# Patient Record
Sex: Female | Born: 1975
Health system: Southern US, Community
[De-identification: ages and names within clinical notes are randomized; demographics above are authoritative.]

---

## 1998-08-12 ENCOUNTER — Ambulatory Visit (HOSPITAL_COMMUNITY): Admission: RE | Admit: 1998-08-12 | Discharge: 1998-08-12 | Payer: Self-pay | Admitting: *Deleted

## 1999-01-13 ENCOUNTER — Inpatient Hospital Stay (HOSPITAL_COMMUNITY): Admission: AD | Admit: 1999-01-13 | Discharge: 1999-01-13 | Payer: Self-pay | Admitting: Obstetrics & Gynecology

## 1999-01-16 ENCOUNTER — Encounter: Payer: Self-pay | Admitting: Obstetrics & Gynecology

## 1999-01-16 ENCOUNTER — Inpatient Hospital Stay (HOSPITAL_COMMUNITY): Admission: AD | Admit: 1999-01-16 | Discharge: 1999-01-18 | Payer: Self-pay | Admitting: *Deleted

## 2002-09-22 ENCOUNTER — Encounter: Payer: Self-pay | Admitting: Emergency Medicine

## 2002-09-22 ENCOUNTER — Emergency Department (HOSPITAL_COMMUNITY): Admission: EM | Admit: 2002-09-22 | Discharge: 2002-09-22 | Payer: Self-pay | Admitting: Emergency Medicine

## 2002-12-05 ENCOUNTER — Encounter: Admission: RE | Admit: 2002-12-05 | Discharge: 2002-12-05 | Payer: Self-pay | Admitting: Obstetrics and Gynecology

## 2002-12-14 ENCOUNTER — Encounter: Admission: RE | Admit: 2002-12-14 | Discharge: 2002-12-14 | Payer: Self-pay | Admitting: Obstetrics and Gynecology

## 2003-01-18 ENCOUNTER — Other Ambulatory Visit: Admission: RE | Admit: 2003-01-18 | Discharge: 2003-01-18 | Payer: Self-pay | Admitting: Obstetrics and Gynecology

## 2003-01-18 ENCOUNTER — Encounter: Admission: RE | Admit: 2003-01-18 | Discharge: 2003-01-18 | Payer: Self-pay | Admitting: Obstetrics and Gynecology

## 2003-01-18 ENCOUNTER — Encounter (INDEPENDENT_AMBULATORY_CARE_PROVIDER_SITE_OTHER): Payer: Self-pay

## 2003-05-30 ENCOUNTER — Other Ambulatory Visit: Admission: RE | Admit: 2003-05-30 | Discharge: 2003-05-30 | Payer: Self-pay | Admitting: Family Medicine

## 2003-08-29 ENCOUNTER — Emergency Department (HOSPITAL_COMMUNITY): Admission: EM | Admit: 2003-08-29 | Discharge: 2003-08-29 | Payer: Self-pay | Admitting: Emergency Medicine

## 2004-08-20 ENCOUNTER — Encounter: Admission: RE | Admit: 2004-08-20 | Discharge: 2004-08-20 | Payer: Self-pay | Admitting: Family Medicine

## 2004-12-09 ENCOUNTER — Other Ambulatory Visit: Admission: RE | Admit: 2004-12-09 | Discharge: 2004-12-09 | Payer: Self-pay | Admitting: Family Medicine

## 2005-06-30 ENCOUNTER — Inpatient Hospital Stay (HOSPITAL_COMMUNITY): Admission: AD | Admit: 2005-06-30 | Discharge: 2005-06-30 | Payer: Self-pay | Admitting: Obstetrics and Gynecology

## 2005-08-18 ENCOUNTER — Inpatient Hospital Stay (HOSPITAL_COMMUNITY): Admission: AD | Admit: 2005-08-18 | Discharge: 2005-08-20 | Payer: Self-pay | Admitting: Obstetrics and Gynecology

## 2006-11-05 ENCOUNTER — Ambulatory Visit (HOSPITAL_COMMUNITY): Admission: RE | Admit: 2006-11-05 | Discharge: 2006-11-05 | Payer: Self-pay | Admitting: Obstetrics & Gynecology

## 2008-05-21 ENCOUNTER — Other Ambulatory Visit: Admission: RE | Admit: 2008-05-21 | Discharge: 2008-05-21 | Payer: Self-pay | Admitting: Obstetrics and Gynecology

## 2011-11-13 ENCOUNTER — Other Ambulatory Visit: Payer: Self-pay

## 2011-11-13 ENCOUNTER — Other Ambulatory Visit (HOSPITAL_COMMUNITY)
Admission: RE | Admit: 2011-11-13 | Discharge: 2011-11-13 | Disposition: A | Discharge status: Home / Self Care | Payer: Self-pay | Source: Ambulatory Visit | Attending: Family Medicine | Admitting: Family Medicine

## 2011-11-13 DIAGNOSIS — Z01419 Encounter for gynecological examination (general) (routine) without abnormal findings: Secondary | ICD-10-CM | POA: Insufficient documentation

## 2012-02-01 ENCOUNTER — Encounter (HOSPITAL_COMMUNITY): Payer: Self-pay

## 2012-02-01 ENCOUNTER — Emergency Department (INDEPENDENT_AMBULATORY_CARE_PROVIDER_SITE_OTHER)
Admission: EM | Admit: 2012-02-01 | Discharge: 2012-02-01 | Disposition: A | Discharge status: Home / Self Care | Payer: Managed Care, Other (non HMO) | Source: Home / Self Care | Attending: Family Medicine | Admitting: Family Medicine

## 2012-02-01 DIAGNOSIS — K089 Disorder of teeth and supporting structures, unspecified: Secondary | ICD-10-CM

## 2012-02-01 DIAGNOSIS — K0889 Other specified disorders of teeth and supporting structures: Secondary | ICD-10-CM

## 2012-02-01 MED ORDER — CLINDAMYCIN HCL 300 MG PO CAPS: 300.0000 mg | ORAL_CAPSULE | Freq: Three times a day (TID) | ORAL | Status: AC

## 2012-02-01 MED ORDER — HYDROCODONE-ACETAMINOPHEN 5-325 MG PO TABS: 2.0000 | ORAL_TABLET | ORAL | Status: AC | PRN

## 2012-02-01 NOTE — ED Notes (Signed)
Facial pain and swelling since yesterday

## 2012-02-01 NOTE — ED Provider Notes (Signed)
Medical screening examination/treatment/procedure(s) were performed by non-physician practitioner and as supervising physician I was immediately available for consultation/collaboration.  Alen Bleacher, MD 02/01/12 2218

## 2012-02-01 NOTE — ED Provider Notes (Signed)
History     CSN: [id number]  Arrival date & time 02/01/12  1543   First MD Initiated Contact with Patient 02/01/12 1747      Chief Complaint  Patient presents with  . Dental Problem    (Consider location/radiation/quality/duration/timing/severity/associated sxs/prior treatment) Patient is a 36 y.o. female presenting with tooth pain. The history is provided by the patient. No language interpreter was used.  Dental PainThe primary symptoms include dental injury. The symptoms began 2 days ago. The symptoms are worsening. The symptoms are new. The symptoms occur constantly.  Additional symptoms include: gum tenderness and pain with swallowing. Medical issues do not include: alcohol problem and smoking.    History reviewed. No pertinent past medical history.  History reviewed. No pertinent past surgical history.  History reviewed. No pertinent family history.  History  Substance Use Topics  . Smoking status: Current Everyday Smoker  . Smokeless tobacco: Not on file  . Alcohol Use: Yes    OB History    Grav Para Term Preterm Abortions TAB SAB Ect Mult Living                  Review of Systems  HENT: Positive for dental problem.   All other systems reviewed and are negative.    Allergies  Review of patient's allergies indicates no known allergies.  Home Medications  No current outpatient prescriptions on file.  BP 125/78  Pulse 88  Temp(Src) 98.4 F (36.9 C) (Oral)  Resp 16  SpO2 100%  LMP 01/29/2012  Physical Exam  Vitals reviewed. Constitutional: She appears well-developed and well-nourished.  HENT:  Head: Normocephalic and atraumatic.  Right Ear: External ear normal.  Left Ear: External ear normal.  Nose: Nose normal.  Mouth/Throat: Oropharynx is clear and moist.       Swollen lower gumline,  Swollen face,  No obv absccess,  2 broken decayed teeth  Eyes: Conjunctivae and EOM are normal. Pupils are equal, round, and reactive to light.  Neck: Normal  range of motion. Neck supple.  Cardiovascular: Normal rate.   Pulmonary/Chest: Effort normal.  Abdominal: Soft.  Musculoskeletal: Normal range of motion.  Neurological: She is alert.  Skin: Skin is warm.  Psychiatric: She has a normal mood and affect.    ED Course  Procedures (including critical care time)  Labs Reviewed - No data to display No results found.   No diagnosis found.    MDM  Clindamycin and hydrocodone.  Pt sees dental works on [facility] protection practitioner.  I advised her to call for an appointment        Elson Areas, [place] 02/01/12 1825

## 2012-02-01 NOTE — Discharge Instructions (Signed)
Toothache Toothaches are usually caused by tooth decay (cavity). However, other causes of toothache include:  Gum disease.   Cracked tooth.   Cracked filling.   Injury.   Jaw problem (temporo mandibular joint or TMJ disorder).   Tooth abscess.   Root sensitivity.   Grinding.   Eruption problems.  Swelling and redness around a painful tooth often means you have a dental abscess. Pain medicine and antibiotics can help reduce symptoms, but you will need to see a dentist within the next few days to have your problem properly evaluated and treated. If tooth decay is the problem, you may need a filling or root canal to save your tooth. If the problem is more severe, your tooth may need to be pulled. SEEK IMMEDIATE MEDICAL CARE IF:  You cannot swallow.   You develop severe swelling, increased redness, or increased pain in your mouth or face.   You have a fever.   You cannot open your mouth adequately.  Document Released: 11/19/2004 Document Revised: 10/01/2011 Document Reviewed: 01/09/2010 Exit[employer] Patient Information 2012 [employer]. 

## 2014-09-27 ENCOUNTER — Encounter: Payer: Self-pay | Admitting: *Deleted

## 2014-09-27 NOTE — Telephone Encounter (Signed)
This encounter was created in error - please disregard.

## 2017-12-01 ENCOUNTER — Encounter (HOSPITAL_COMMUNITY): Payer: Self-pay | Admitting: *Deleted

## 2017-12-01 ENCOUNTER — Ambulatory Visit (HOSPITAL_COMMUNITY)
Admission: EM | Admit: 2017-12-01 | Discharge: 2017-12-01 | Disposition: A | Discharge status: Home / Self Care | Payer: Managed Care, Other (non HMO) | Attending: Family Medicine | Admitting: Family Medicine

## 2017-12-01 DIAGNOSIS — R69 Illness, unspecified: Secondary | ICD-10-CM

## 2017-12-01 DIAGNOSIS — J111 Influenza due to unidentified influenza virus with other respiratory manifestations: Secondary | ICD-10-CM

## 2017-12-01 MED ORDER — OSELTAMIVIR PHOSPHATE 75 MG PO CAPS: 75.0000 mg | ORAL_CAPSULE | Freq: Two times a day (BID) | ORAL | 0 refills | Status: AC

## 2017-12-01 MED ORDER — HYDROCODONE-HOMATROPINE 5-1.5 MG/5ML PO SYRP: 5.0000 mL | ORAL_SOLUTION | Freq: Four times a day (QID) | ORAL | 0 refills | Status: DC | PRN

## 2017-12-01 NOTE — ED Triage Notes (Signed)
Patient reports chills, body aches, fever, runny nose, and upset stomach since yesterday. Patient has been taking OTC meds to help with symptoms and fever.

## 2017-12-01 NOTE — Discharge Instructions (Signed)

## 2017-12-01 NOTE — ED Provider Notes (Signed)
  [facility]   [id number] 12/01/17 Arrival Time: 1006  ASSESSMENT & PLAN:  1. Influenza-like illness     Meds ordered this encounter  Medications  . HYDROcodone-homatropine (HYCODAN) 5-1.5 MG/5ML syrup    Sig: Take 5 mLs by mouth every 6 (six) hours as needed for cough.    Dispense:  90 mL    Refill:  0  . oseltamivir (TAMIFLU) 75 MG capsule    Sig: Take 1 capsule (75 mg total) by mouth 2 (two) times daily for 5 days.    Dispense:  10 capsule    Refill:  0   Cough medication sedation precautions. Discussed typical duration of symptoms. OTC symptom care as needed. Ensure adequate fluid intake and rest. May f/u with PCP or here as needed.  Reviewed expectations re: course of current medical issues. Questions answered. Outlined signs and symptoms indicating need for more acute intervention. Patient verbalized understanding. After Visit Summary given.   SUBJECTIVE: History from: patient.  Dossie DerStephanie R Kerney is a 42 y.o. female who presents with complaint of nasal congestion, post-nasal drainage, and a persistent dry cough. Onset abrupt, approximately 1 day ago. Overall fatigued with body aches. SOB: none. Wheezing: none. Fever: yes, subjective. Overall normal PO intake without n/v. Sick contacts: no. OTC treatment: Theraflu without much relief.  Received flu shot this year: no.  Social History   Tobacco Use  Smoking Status Current Every Day Smoker  . Types: Cigarettes  Smokeless Tobacco Never Used    ROS: As per HPI.   OBJECTIVE:  Vitals:   12/01/17 1053  BP: (!) 106/52  Pulse: 89  Resp: 18  Temp: 98.3 F (36.8 C)  TempSrc: Oral  SpO2: 98%     General appearance: alert; appears fatigued HEENT: nasal congestion; clear runny nose; throat irritation secondary to post-nasal drainage Neck: supple without LAD Lungs: unlabored respirations, symmetrical air entry; cough: moderate; no respiratory distress Skin: warm and dry Psychological: alert and  cooperative; normal mood and affect    No Known Allergies   Social History   Socioeconomic History  . Marital status: Single    Spouse name: Not on file  . Number of children: Not on file  . Years of education: Not on file  . Highest education level: Not on file  Social Needs  . Financial resource strain: Not on file  . Food insecurity - worry: Not on file  . Food insecurity - inability: Not on file  . Transportation needs - medical: Not on file  . Transportation needs - non-medical: Not on file  Occupational History  . Not on file  Tobacco Use  . Smoking status: Current Every Day Smoker    Types: Cigarettes  . Smokeless tobacco: Never Used  Substance and Sexual Activity  . Alcohol use: Yes  . Drug use: Not on file  . Sexual activity: Not on file  Other Topics Concern  . Not on file  Social History Narrative  . Not on file           Mardella LaymanHagler, Shanvi Moyd, MD 12/01/17 1110

## 2019-10-03 ENCOUNTER — Other Ambulatory Visit: Payer: Self-pay

## 2019-10-03 ENCOUNTER — Ambulatory Visit (HOSPITAL_COMMUNITY)
Admission: EM | Admit: 2019-10-03 | Discharge: 2019-10-03 | Disposition: A | Discharge status: Home / Self Care | Payer: Self-pay | Attending: Family Medicine | Admitting: Family Medicine

## 2019-10-03 ENCOUNTER — Encounter (HOSPITAL_COMMUNITY): Payer: Self-pay

## 2019-10-03 DIAGNOSIS — K047 Periapical abscess without sinus: Secondary | ICD-10-CM

## 2019-10-03 MED ORDER — METRONIDAZOLE 500 MG PO TABS
500.0000 mg | ORAL_TABLET | Freq: Once | ORAL | Status: AC
  Administered 2019-10-03: 500 mg via ORAL

## 2019-10-03 MED ORDER — METRONIDAZOLE 500 MG PO TABS
ORAL_TABLET | ORAL | Status: AC
  Filled 2019-10-03: qty 1

## 2019-10-03 MED ORDER — CEFTRIAXONE SODIUM 1 G IJ SOLR
INTRAMUSCULAR | Status: AC
  Filled 2019-10-03: qty 10

## 2019-10-03 MED ORDER — CEFTRIAXONE SODIUM 1 G IJ SOLR
1.0000 g | Freq: Once | INTRAMUSCULAR | Status: AC
  Administered 2019-10-03: 20:00:00 1 g via INTRAMUSCULAR

## 2019-10-03 MED ORDER — AMOXICILLIN-POT CLAVULANATE 875-125 MG PO TABS: 1.0000 | ORAL_TABLET | Freq: Two times a day (BID) | ORAL | 0 refills | Status: AC

## 2019-10-03 NOTE — ED Provider Notes (Signed)
[facility]    CSN: [id number] Arrival date & time: 10/03/19  1849      History   Chief Complaint Chief Complaint  Patient presents with  . Appointment    19:00  . Abscess    HPI ALIEGHA PAULLIN is a 43 y.o. female.   She is presenting with left lower submandibular swelling and dental pain.  The pain is been ongoing for a few days.  The pain is constant and severe.  Has had a long history of caries and dental infections.  Has not taken anything for the pain.  No trouble swallowing or breathing.  No fevers.  Symptoms seem to be localized to the left lower mandibular region.  No drooling.  Able to speak in full sentences.  HPI  History reviewed. No pertinent past medical history.  There are no active problems to display for this patient.   History reviewed. No pertinent surgical history.  OB History   No obstetric history on file.      Home Medications    Prior to Admission medications   Medication Sig Start Date End Date Taking? Authorizing Provider  naproxen sodium (ALEVE) 220 MG tablet Take 220 mg by mouth.   Yes [provider]  amoxicillin-clavulanate (AUGMENTIN) 875-125 MG tablet Take 1 tablet by mouth 2 (two) times daily for 10 days. 10/03/19 10/13/19  Myra Rude, MD  HYDROcodone-homatropine [facility]) 5-1.5 MG/5ML syrup Take 5 mLs by mouth every 6 (six) hours as needed for cough. 12/01/17   Mardella Layman, MD    Family History History reviewed. No pertinent family history.  Social History Social History   Tobacco Use  . Smoking status: Current Every Day Smoker    Types: Cigarettes  . Smokeless tobacco: Never Used  Substance Use Topics  . Alcohol use: Yes  . Drug use: Not on file     Allergies   Patient has no known allergies.   Review of Systems Review of Systems  Constitutional: Negative for fever.  HENT: Positive for dental problem.   Respiratory: Negative for cough.   Cardiovascular: Negative for chest pain.   Gastrointestinal: Negative for abdominal distention.  Musculoskeletal: Negative for back pain.  Skin: Negative for color change.  Neurological: Negative for weakness.  Hematological: Negative for adenopathy.     Physical Exam Triage Vital Signs ED Triage Vitals  Enc Vitals Group     BP 10/03/19 1910 137/80     Pulse Rate 10/03/19 1910 (!) 105     Resp 10/03/19 1910 16     Temp 10/03/19 1910 99.1 F (37.3 C)     Temp Source 10/03/19 1910 Oral     SpO2 10/03/19 1910 99 %     Weight --      Height --      Head Circumference --      Peak Flow --      Pain Score 10/03/19 1908 4     Pain Loc --      Pain Edu? --      Excl. in GC? --    No data found.  Updated Vital Signs BP 137/80 (BP Location: Right Arm)   Pulse (!) 105   Temp 99.1 F (37.3 C) (Oral)   Resp 16   LMP 09/23/2019   SpO2 99%   Visual Acuity Right Eye Distance:   Left Eye Distance:   Bilateral Distance:    Right Eye Near:   Left Eye Near:    Bilateral Near:  Physical Exam Gen: NAD, alert, cooperative with exam, well-appearing ENT: normal lips, normal nasal mucosa, significant swelling in the left lower mandible, indurated in this area, no fluctuance appreciated, no discharge in the mouth appreciated.  Poor dentition throughout with multiple pulled teeth Eye: normal EOM, normal conjunctiva and lids CV:  no edema, +2 pedal pulses   Resp: no accessory muscle use, non-labored,  Skin: no rashes,  Neuro: normal tone, normal sensation to touch Psych:  normal insight, alert and oriented MSK: Normal gait, normal strength   UC Treatments / Results  Labs (all labs ordered are listed, but only abnormal results are displayed) Labs Reviewed - No data to display  EKG   Radiology No results found.  Procedures Procedures (including critical care time)  Medications Ordered in UC Medications  cefTRIAXone (ROCEPHIN) injection 1 g (1 g Intramuscular Given 10/03/19 1945)  metroNIDAZOLE (FLAGYL) tablet  500 mg (500 mg Oral Given 10/03/19 1944)  metroNIDAZOLE (FLAGYL) 500 MG tablet (has no administration in time range)  cefTRIAXone (ROCEPHIN) 1 g injection (has no administration in time range)    Initial Impression / Assessment and Plan / UC Course  I have reviewed the triage vital signs and the nursing notes.  Pertinent labs & imaging results that were available during my care of the patient were reviewed by me and considered in my medical decision making (see chart for details).     Ms. Szafran is a 43 year old female that is presenting with lower mandible infection.  No actual abscess appreciated upon exam.  Has significant swelling and pain of this lower mandible region.  Counseled on need for good follow-up.  Provided ceftriaxone tonight as well as metronidazole.  Sent Augmentin into start tomorrow.  Counseled on supportive care.  Given indications to seek more immediate care.  Final Clinical Impressions(s) / UC Diagnoses   Final diagnoses:  Dental abscess     Discharge Instructions     Please start the antibiotics tomorrow  Please be seen in the emergency department if you don't get improvement with the antibiotics  Please try warm compress on the area  Please follow up with a dentist soon.     ED Prescriptions    Medication Sig Dispense Auth. Provider   amoxicillin-clavulanate (AUGMENTIN) 875-125 MG tablet Take 1 tablet by mouth 2 (two) times daily for 10 days. 20 tablet Rosemarie Ax, MD     PDMP not reviewed this encounter.   Rosemarie Ax, MD 10/03/19 Casimer Lanius

## 2019-10-03 NOTE — Discharge Instructions (Signed)
Please start the antibiotics tomorrow  Please be seen in the emergency department if you don't get improvement with the antibiotics  Please try warm compress on the area  Please follow up with a dentist soon.

## 2019-10-03 NOTE — ED Triage Notes (Signed)
Pt states she is having left sided abscess in her mouth x 2 days. Pt states her mouth and jaw is swelling and painful. Pt is taking Aleve.

## 2020-01-18 ENCOUNTER — Ambulatory Visit: Payer: Self-pay | Attending: Internal Medicine

## 2020-01-18 DIAGNOSIS — Z23 Encounter for immunization: Secondary | ICD-10-CM

## 2020-01-18 NOTE — Progress Notes (Signed)
   Covid-19 Vaccination Clinic  Name:  GESSELLE FITZSIMONS    MRN: [id number] DOB: October 13, 1976  01/18/2020  Ms. Mault was observed post Covid-19 immunization for 15 minutes without incident. She was provided with Vaccine Information Sheet and instruction to access the V-Safe system.   Ms. Ingerson was instructed to call 911 with any severe reactions post vaccine: Marland Kitchen Difficulty breathing  . Swelling of face and throat  . A fast heartbeat  . A bad rash all over body  . Dizziness and weakness   Immunizations Administered    Name Date Dose VIS Date Route   Pfizer COVID-19 Vaccine 01/18/2020  8:48 AM 0.3 mL 10/06/2019 Intramuscular   Manufacturer: [employer], [employer]   Lot: [id number]   NDC: [address]-0

## 2020-02-13 ENCOUNTER — Ambulatory Visit: Payer: Self-pay | Attending: Internal Medicine

## 2020-02-13 DIAGNOSIS — Z23 Encounter for immunization: Secondary | ICD-10-CM

## 2020-02-13 NOTE — Progress Notes (Signed)
   Covid-19 Vaccination Clinic  Name:  Danielle Bradford    MRN: [id number] DOB: May 12, 1976  02/13/2020  [name] was observed post Covid-19 immunization for 15 minutes without incident. She was provided with Vaccine Information Sheet and instruction to access the V-Safe system.   [name] was instructed to call 911 with any severe reactions post vaccine: Marland Kitchen Difficulty breathing  . Swelling of face and throat  . A fast heartbeat  . A bad rash all over body  . Dizziness and weakness   Immunizations Administered    Name Date Dose VIS Date Route   Pfizer COVID-19 Vaccine 02/13/2020  8:25 AM 0.3 mL 12/20/2018 Intramuscular   Manufacturer: [employer], [employer]   Lot: [id number]   NDC: [address]-0

## 2020-06-24 ENCOUNTER — Other Ambulatory Visit: Payer: Self-pay

## 2020-06-24 DIAGNOSIS — Z20822 Contact with and (suspected) exposure to covid-19: Secondary | ICD-10-CM

## 2020-06-26 LAB — NOVEL CORONAVIRUS, NAA: SARS-CoV-2, NAA: NOT DETECTED

## 2021-04-10 ENCOUNTER — Encounter: Payer: Self-pay | Admitting: Emergency Medicine

## 2021-04-10 ENCOUNTER — Other Ambulatory Visit: Payer: Self-pay

## 2021-04-10 ENCOUNTER — Emergency Department
Admission: EM | Admit: 2021-04-10 | Discharge: 2021-04-10 | Disposition: A | Discharge status: Home / Self Care | Payer: No Typology Code available for payment source | Attending: Emergency Medicine | Admitting: Emergency Medicine

## 2021-04-10 ENCOUNTER — Emergency Department: Payer: No Typology Code available for payment source

## 2021-04-10 DIAGNOSIS — S060X0A Concussion without loss of consciousness, initial encounter: Secondary | ICD-10-CM

## 2021-04-10 DIAGNOSIS — S161XXA Strain of muscle, fascia and tendon at neck level, initial encounter: Secondary | ICD-10-CM | POA: Diagnosis not present

## 2021-04-10 DIAGNOSIS — Y92410 Unspecified street and highway as the place of occurrence of the external cause: Secondary | ICD-10-CM | POA: Diagnosis not present

## 2021-04-10 DIAGNOSIS — F17210 Nicotine dependence, cigarettes, uncomplicated: Secondary | ICD-10-CM | POA: Diagnosis not present

## 2021-04-10 DIAGNOSIS — S0990XA Unspecified injury of head, initial encounter: Secondary | ICD-10-CM | POA: Diagnosis present

## 2021-04-10 MED ORDER — ACETAMINOPHEN 500 MG PO TABS
1000.0000 mg | ORAL_TABLET | Freq: Once | ORAL | Status: AC
  Administered 2021-04-10: 19:00:00 1000 mg via ORAL
  Filled 2021-04-10: qty 2

## 2021-04-10 MED ORDER — LIDOCAINE 5 % EX PTCH
1.0000 | MEDICATED_PATCH | CUTANEOUS | Status: DC
  Administered 2021-04-10: 19:00:00 1 via TRANSDERMAL
  Filled 2021-04-10: qty 1

## 2021-04-10 MED ORDER — IBUPROFEN 400 MG PO TABS
400.0000 mg | ORAL_TABLET | Freq: Once | ORAL | Status: DC
  Filled 2021-04-10: qty 1
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
## 2021-12-04 ENCOUNTER — Other Ambulatory Visit: Payer: Self-pay

## 2021-12-04 ENCOUNTER — Ambulatory Visit (INDEPENDENT_AMBULATORY_CARE_PROVIDER_SITE_OTHER): Payer: 59 | Admitting: Family Medicine

## 2021-12-04 ENCOUNTER — Telehealth: Payer: Self-pay | Admitting: *Deleted

## 2021-12-04 ENCOUNTER — Encounter: Payer: Self-pay | Admitting: Family Medicine

## 2021-12-04 VITALS — BP 129/86 | HR 95 | Temp 98.1°F | Resp 16 | Ht 62.0 in | Wt 181.4 lb

## 2021-12-04 DIAGNOSIS — Z1231 Encounter for screening mammogram for malignant neoplasm of breast: Secondary | ICD-10-CM

## 2021-12-04 DIAGNOSIS — Z130 Encounter for screening for diseases of the blood and blood-forming organs and certain disorders involving the immune mechanism: Secondary | ICD-10-CM | POA: Diagnosis not present

## 2021-12-04 DIAGNOSIS — Z0000 Encounter for general adult medical examination without abnormal findings: Secondary | ICD-10-CM | POA: Diagnosis not present

## 2021-12-04 DIAGNOSIS — Z13228 Encounter for screening for other metabolic disorders: Secondary | ICD-10-CM

## 2021-12-04 DIAGNOSIS — Z13220 Encounter for screening for lipoid disorders: Secondary | ICD-10-CM

## 2021-12-04 DIAGNOSIS — Z1159 Encounter for screening for other viral diseases: Secondary | ICD-10-CM

## 2021-12-04 DIAGNOSIS — Z1329 Encounter for screening for other suspected endocrine disorder: Secondary | ICD-10-CM | POA: Diagnosis not present

## 2021-12-04 DIAGNOSIS — Z7689 Persons encountering health services in other specified circumstances: Secondary | ICD-10-CM

## 2021-12-04 DIAGNOSIS — Z114 Encounter for screening for human immunodeficiency virus [HIV]: Secondary | ICD-10-CM

## 2021-12-04 LAB — POCT URINALYSIS DIP (CLINITEK)
Bilirubin, UA: NEGATIVE
Glucose, UA: NEGATIVE mg/dL
Ketones, POC UA: NEGATIVE mg/dL
Leukocytes, UA: NEGATIVE
Nitrite, UA: POSITIVE — AB
POC PROTEIN,UA: NEGATIVE
Spec Grav, UA: 1.02 (ref 1.010–1.025)
Urobilinogen, UA: 0.2 E.U./dL
pH, UA: 7 (ref 5.0–8.0)

## 2021-12-04 MED ORDER — NITROFURANTOIN MONOHYD MACRO 100 MG PO CAPS: 100.0000 mg | ORAL_CAPSULE | Freq: Two times a day (BID) | ORAL | 0 refills | Status: AC

## 2021-12-04 NOTE — Progress Notes (Signed)
Patient is here to est care with provider Patient would like CPE today.  Patient is concern about veins on her calf's that comes and goes away. Patient is also concern about having a strong smell in her urine with/without water

## 2021-12-05 ENCOUNTER — Encounter: Payer: Self-pay | Admitting: Family Medicine

## 2021-12-05 LAB — CBC WITH DIFFERENTIAL/PLATELET
Basophils Absolute: 0 10*3/uL (ref 0.0–0.2)
Basos: 1 %
EOS (ABSOLUTE): 0.2 10*3/uL (ref 0.0–0.4)
Eos: 4 %
Hematocrit: 37.2 % (ref 34.0–46.6)
Hemoglobin: 13.4 g/dL (ref 11.1–15.9)
Immature Grans (Abs): 0 10*3/uL (ref 0.0–0.1)
Immature Granulocytes: 0 %
Lymphocytes Absolute: 1.7 10*3/uL (ref 0.7–3.1)
Lymphs: 32 %
MCH: 34.7 pg — ABNORMAL HIGH (ref 26.6–33.0)
MCHC: 36 g/dL — ABNORMAL HIGH (ref 31.5–35.7)
MCV: 96 fL (ref 79–97)
Monocytes Absolute: 0.3 10*3/uL (ref 0.1–0.9)
Monocytes: 5 %
Neutrophils Absolute: 3.1 10*3/uL (ref 1.4–7.0)
Neutrophils: 58 %
Platelets: 215 10*3/uL (ref 150–450)
RBC: 3.86 x10E6/uL (ref 3.77–5.28)
RDW: 11.9 % (ref 11.7–15.4)
WBC: 5.3 10*3/uL (ref 3.4–10.8)

## 2021-12-05 LAB — LIPID PANEL
Chol/HDL Ratio: 3.4 ratio (ref 0.0–4.4)
Cholesterol, Total: 189 mg/dL (ref 100–199)
HDL: 56 mg/dL (ref >39)
LDL Chol Calc (NIH): 113 mg/dL — ABNORMAL HIGH (ref 0–99)
Triglycerides: 112 mg/dL (ref 0–149)
VLDL Cholesterol Cal: 20 mg/dL (ref 5–40)

## 2021-12-05 LAB — TSH: TSH: 0.927 u[IU]/mL (ref 0.450–4.500)

## 2021-12-05 NOTE — Progress Notes (Signed)
New Patient Office Visit  Subjective:  Patient ID: Danielle Bradford, female    DOB: 27-Aug-1976  Age: 46 y.o. MRN: [id number]  CC:  Chief Complaint  Patient presents with   Establish Care   Annual Exam    HPI Danielle Bradford presents for to establish care and for routine annual exam. Patient denies acute complaints or concerns.   No past medical history on file.  No past surgical history on file.  No family history on file.  Social History   Socioeconomic History   Marital status: Single    Spouse name: Not on file   Number of children: Not on file   Years of education: Not on file   Highest education level: Not on file  Occupational History   Not on file  Tobacco Use   Smoking status: Every Day    Types: Cigarettes   Smokeless tobacco: Never  Substance and Sexual Activity   Alcohol use: Yes   Drug use: Not on file   Sexual activity: Not on file  Other Topics Concern   Not on file  Social History Narrative   ** Merged History Encounter **       Social Determinants of Health   Financial Resource Strain: Not on file  Food Insecurity: Not on file  Transportation Needs: Not on file  Physical Activity: Not on file  Stress: Not on file  Social Connections: Not on file  Intimate Partner Violence: Not on file    ROS Review of Systems  All other systems reviewed and are negative.  Objective:   Today's Vitals: BP 129/86    Pulse 95    Temp 98.1 F (36.7 C) (Oral)    Resp 16    Ht 5\' 2"  (1.575 m)    Wt 181 lb 6.4 oz (82.3 kg)    SpO2 98%    BMI 33.18 kg/m   Physical Exam Vitals and nursing note reviewed.  Constitutional:      General: She is not in acute distress. HENT:     Head: Normocephalic and atraumatic.     Right Ear: Tympanic membrane, ear canal and external ear normal.     Left Ear: Tympanic membrane, ear canal and external ear normal.     Nose: Nose normal.     Mouth/Throat:     Mouth: Mucous membranes are moist.     Pharynx:  Oropharynx is clear.  Eyes:     Conjunctiva/sclera: Conjunctivae normal.     Pupils: Pupils are equal, round, and reactive to light.  Neck:     Thyroid: No thyromegaly.  Cardiovascular:     Rate and Rhythm: Normal rate and regular rhythm.     Heart sounds: Normal heart sounds. No murmur heard. Pulmonary:     Effort: Pulmonary effort is normal. No respiratory distress.     Breath sounds: Normal breath sounds.  Abdominal:     General: There is no distension.     Palpations: Abdomen is soft. There is no mass.     Tenderness: There is no abdominal tenderness.  Musculoskeletal:        General: Normal range of motion.     Cervical back: Normal range of motion and neck supple.  Skin:    General: Skin is warm and dry.  Neurological:     General: No focal deficit present.     Mental Status: She is alert and oriented to person, place, and time.  Psychiatric:  Mood and Affect: Mood normal.        Behavior: Behavior normal.    Assessment & Plan:   1. Annual physical exam Routine labs ordered.  - POCT URINALYSIS DIP (CLINITEK) - Urine Culture - CBC with Differential - Lipid Panel  2. Encounter to establish care   3. Screening for deficiency anemia   4. Screening for endocrine/metabolic/immunity disorders  - TSH  5. Encounter for screening mammogram for malignant neoplasm of breast Referral for mammogram.  - MM Digital Screening; Future  6. Lipid screening  - Lipid Panel    Outpatient Encounter Medications as of 12/04/2021  Medication Sig   nitrofurantoin, macrocrystal-monohydrate, (MACROBID) 100 MG capsule Take 1 capsule (100 mg total) by mouth 2 (two) times daily.   [DISCONTINUED] HYDROcodone-homatropine (HYCODAN) 5-1.5 MG/5ML syrup Take 5 mLs by mouth every 6 (six) hours as needed for cough.   [DISCONTINUED] naproxen sodium (ALEVE) 220 MG tablet Take 220 mg by mouth.   No facility-administered encounter medications on file as of 12/04/2021.    Follow-up: No  follow-ups on file.   Tommie Raymond, MD

## 2021-12-05 NOTE — Telephone Encounter (Signed)
error 

## 2021-12-08 LAB — URINE CULTURE

## 2022-01-15 ENCOUNTER — Ambulatory Visit
Admission: RE | Admit: 2022-01-15 | Discharge: 2022-01-15 | Disposition: A | Discharge status: Home / Self Care | Payer: 59 | Source: Ambulatory Visit | Attending: Family Medicine | Admitting: Family Medicine

## 2022-01-15 ENCOUNTER — Other Ambulatory Visit: Payer: Self-pay

## 2022-01-15 DIAGNOSIS — Z1231 Encounter for screening mammogram for malignant neoplasm of breast: Secondary | ICD-10-CM

## 2022-01-19 ENCOUNTER — Other Ambulatory Visit: Payer: Self-pay | Admitting: Family Medicine

## 2022-01-19 DIAGNOSIS — R928 Other abnormal and inconclusive findings on diagnostic imaging of breast: Secondary | ICD-10-CM

## 2022-02-23 ENCOUNTER — Ambulatory Visit: Payer: 59

## 2022-02-23 ENCOUNTER — Ambulatory Visit
Admission: RE | Admit: 2022-02-23 | Discharge: 2022-02-23 | Disposition: A | Discharge status: Home / Self Care | Payer: BC Managed Care – PPO | Source: Ambulatory Visit | Attending: Family Medicine | Admitting: Family Medicine

## 2022-02-23 ENCOUNTER — Ambulatory Visit
Admission: RE | Admit: 2022-02-23 | Discharge: 2022-02-23 | Disposition: A | Discharge status: Home / Self Care | Payer: 59 | Source: Ambulatory Visit | Attending: Family Medicine | Admitting: Family Medicine

## 2022-02-23 DIAGNOSIS — R928 Other abnormal and inconclusive findings on diagnostic imaging of breast: Secondary | ICD-10-CM

## 2022-05-11 ENCOUNTER — Encounter: Payer: Self-pay | Admitting: Family Medicine

## 2022-05-11 ENCOUNTER — Ambulatory Visit: Payer: BC Managed Care – PPO | Admitting: Family Medicine

## 2022-05-11 DIAGNOSIS — R600 Localized edema: Secondary | ICD-10-CM

## 2022-05-11 DIAGNOSIS — Z87891 Personal history of nicotine dependence: Secondary | ICD-10-CM

## 2022-05-11 MED ORDER — FUROSEMIDE 20 MG PO TABS: 20.0000 mg | ORAL_TABLET | Freq: Every day | ORAL | 0 refills | Status: DC | PRN

## 2022-05-11 NOTE — Progress Notes (Signed)
   Established Patient Office Visit  Subjective    Patient ID: Danielle Bradford, female    DOB: [birth year]  Age: 46 y.o. MRN: [id number]  CC:  Chief Complaint  Patient presents with   Ankle Pain   Foot Swelling    HPI Danielle Bradford presents with complaint of swelling of her legs and ankles for a couple of weeks. Sx cause intermittent pain.    Outpatient Encounter Medications as of 05/11/2022  Medication Sig   furosemide (LASIX) 20 MG tablet Take 1 tablet (20 mg total) by mouth daily as needed.   nitrofurantoin, macrocrystal-monohydrate, (MACROBID) 100 MG capsule Take 1 capsule (100 mg total) by mouth 2 (two) times daily. (Patient not taking: Reported on 05/11/2022)   No facility-administered encounter medications on file as of 05/11/2022.    History reviewed. No pertinent past medical history.  History reviewed. No pertinent surgical history.  History reviewed. No pertinent family history.  Social History   Socioeconomic History   Marital status: Single    Spouse name: Not on file   Number of children: Not on file   Years of education: Not on file   Highest education level: Not on file  Occupational History   Not on file  Tobacco Use   Smoking status: Every Day    Types: Cigarettes   Smokeless tobacco: Never  Substance and Sexual Activity   Alcohol use: Yes   Drug use: Not on file   Sexual activity: Not on file  Other Topics Concern   Not on file  Social History Narrative   ** Merged History Encounter **       Social Determinants of Health   Financial Resource Strain: Not on file  Food Insecurity: Not on file  Transportation Needs: Not on file  Physical Activity: Not on file  Stress: Not on file  Social Connections: Not on file  Intimate Partner Violence: Not on file    Review of Systems  Respiratory:  Negative for shortness of breath.   Cardiovascular:  Positive for leg swelling. Negative for chest pain.  All other systems reviewed and are  negative.       Objective    There were no vitals taken for this visit.  Physical Exam Vitals and nursing note reviewed.  Constitutional:      General: She is not in acute distress. Cardiovascular:     Rate and Rhythm: Normal rate and regular rhythm.  Pulmonary:     Effort: Pulmonary effort is normal.     Breath sounds: Normal breath sounds.  Abdominal:     Palpations: Abdomen is soft.     Tenderness: There is no abdominal tenderness.  Musculoskeletal:        General: Tenderness present.     Right lower leg: Edema present.     Left lower leg: Edema present.  Neurological:     General: No focal deficit present.     Mental Status: She is alert and oriented to person, place, and time.         Assessment & Plan:   1. Localized edema Monitoring labs ordered. Lasix prescribed - Basic Metabolic Panel - furosemide (LASIX) 20 MG tablet; Take 1 tablet (20 mg total) by mouth daily as needed.  Dispense: 30 tablet; Refill: 0  2. Stopped smoking less than 1 month ago Encouraged continued cessation    Return in about 1 week (around 05/18/2022) for follow up.   Tommie Raymond, MD

## 2022-05-12 LAB — BASIC METABOLIC PANEL
BUN/Creatinine Ratio: 18 (ref 9–23)
BUN: 14 mg/dL (ref 6–24)
CO2: 19 mmol/L — ABNORMAL LOW (ref 20–29)
Calcium: 9.3 mg/dL (ref 8.7–10.2)
Chloride: 105 mmol/L (ref 96–106)
Creatinine, Ser: 0.78 mg/dL (ref 0.57–1.00)
Glucose: 92 mg/dL (ref 70–99)
Potassium: 4 mmol/L (ref 3.5–5.2)
Sodium: 139 mmol/L (ref 134–144)
eGFR: 96 mL/min/{1.73_m2} (ref >59)

## 2022-05-21 ENCOUNTER — Ambulatory Visit: Payer: BC Managed Care – PPO | Admitting: Family Medicine

## 2022-05-21 VITALS — BP 131/81 | HR 82 | Temp 97.8°F | Resp 16 | Wt 181.0 lb

## 2022-05-21 DIAGNOSIS — R600 Localized edema: Secondary | ICD-10-CM | POA: Diagnosis not present

## 2022-05-21 NOTE — Progress Notes (Signed)
Patient is here to follow-up on here swelling of feet and legs.Patient said  legs are gone  down  but not feet.

## 2022-05-23 ENCOUNTER — Encounter: Payer: Self-pay | Admitting: Family Medicine

## 2022-05-23 NOTE — Progress Notes (Signed)
   Established Patient Office Visit  Subjective    Patient ID: Danielle Bradford, female    DOB: [birth year]  Age: 46 y.o. MRN: [id number]  CC:  Chief Complaint  Patient presents with   Follow-up    swelling    HPI Danielle Bradford presents for follow up of edema. She reports that it is improved but not resolved   Outpatient Encounter Medications as of 05/21/2022  Medication Sig   furosemide (LASIX) 20 MG tablet Take 1 tablet (20 mg total) by mouth daily as needed.   nitrofurantoin, macrocrystal-monohydrate, (MACROBID) 100 MG capsule Take 1 capsule (100 mg total) by mouth 2 (two) times daily. (Patient not taking: Reported on 05/11/2022)   No facility-administered encounter medications on file as of 05/21/2022.    No past medical history on file.  No past surgical history on file.  No family history on file.  Social History   Socioeconomic History   Marital status: Single    Spouse name: Not on file   Number of children: Not on file   Years of education: Not on file   Highest education level: Not on file  Occupational History   Not on file  Tobacco Use   Smoking status: Every Day    Types: Cigarettes   Smokeless tobacco: Never  Substance and Sexual Activity   Alcohol use: Yes   Drug use: Not on file   Sexual activity: Not on file  Other Topics Concern   Not on file  Social History Narrative   ** Merged History Encounter **       Social Determinants of Health   Financial Resource Strain: Not on file  Food Insecurity: Not on file  Transportation Needs: Not on file  Physical Activity: Not on file  Stress: Not on file  Social Connections: Not on file  Intimate Partner Violence: Not on file    Review of Systems  Respiratory:  Negative for shortness of breath.   Cardiovascular:  Positive for leg swelling. Negative for chest pain.  All other systems reviewed and are negative.       Objective    BP 131/81   Pulse 82   Temp 97.8 F (36.6 C)  (Oral)   Resp 16   Wt 181 lb (82.1 kg)   SpO2 98%   BMI 33.11 kg/m   Physical Exam Vitals and nursing note reviewed.  Constitutional:      General: She is not in acute distress. Cardiovascular:     Rate and Rhythm: Normal rate and regular rhythm.  Pulmonary:     Effort: Pulmonary effort is normal.     Breath sounds: Normal breath sounds.  Abdominal:     Palpations: Abdomen is soft.     Tenderness: There is no abdominal tenderness.  Musculoskeletal:     Right lower leg: Edema present.     Left lower leg: Edema present.  Neurological:     General: No focal deficit present.     Mental Status: She is alert and oriented to person, place, and time.         Assessment & Plan:   1. Bilateral leg edema Will refer to vascular for further eval/mgt - Ambulatory referral to Vascular Surgery    No follow-ups on file.   Tommie Raymond, MD

## 2022-06-03 ENCOUNTER — Other Ambulatory Visit: Payer: Self-pay | Admitting: Family Medicine

## 2022-06-03 DIAGNOSIS — R600 Localized edema: Secondary | ICD-10-CM

## 2022-06-03 NOTE — Telephone Encounter (Signed)
Requested Prescriptions  Pending Prescriptions Disp Refills  . furosemide (LASIX) 20 MG tablet [Pharmacy Med Name: FUROSEMIDE 20 MG TABLET] 90 tablet 1    Sig: TAKE 1 TABLET BY MOUTH EVERY DAY AS NEEDED     Cardiovascular:  Diuretics - Loop Failed - 06/03/2022  2:31 PM      Failed - Mg Level in normal range and within 180 days    No results found for: "MG"       Passed - K in normal range and within 180 days    Potassium  Date Value Ref Range Status  05/11/2022 4.0 3.5 - 5.2 mmol/L Final         Passed - Ca in normal range and within 180 days    Calcium  Date Value Ref Range Status  05/11/2022 9.3 8.7 - 10.2 mg/dL Final         Passed - Na in normal range and within 180 days    Sodium  Date Value Ref Range Status  05/11/2022 139 134 - 144 mmol/L Final         Passed - Cr in normal range and within 180 days    Creatinine, Ser  Date Value Ref Range Status  05/11/2022 0.78 0.57 - 1.00 mg/dL Final         Passed - Cl in normal range and within 180 days    Chloride  Date Value Ref Range Status  05/11/2022 105 96 - 106 mmol/L Final         Passed - Last BP in normal range    BP Readings from Last 1 Encounters:  05/21/22 131/81         Passed - Valid encounter within last 6 months    Recent Outpatient Visits          1 week ago Bilateral leg edema   [facility], MD   3 weeks ago Localized edema   [facility] at [facility], MD   6 months ago Annual physical exam   [facility] at [facility], MD

## 2022-07-17 ENCOUNTER — Other Ambulatory Visit: Payer: Self-pay | Admitting: *Deleted

## 2022-07-17 DIAGNOSIS — M79604 Pain in right leg: Secondary | ICD-10-CM

## 2022-07-24 NOTE — Progress Notes (Unsigned)
Requested by:  Dorna Mai, [address] [place],  [place] [id number]  Reason for consultation: ***    History of Present Illness   Danielle Bradford is a 46 y.o. (1975-12-30) female who presents for evaluation of ***  Venous symptoms include: (aching, heavy, tired, throbbing, burning, itching, swelling, bleeding, ulcer)  *** Onset/duration:  ***  Occupation:  *** Aggravating factors: (sitting, standing) Alleviating factors: (elevation) Compression:  *** Helps:  *** Pain medications:  *** Previous vein procedures:  *** History of DVT:  ***  No past medical history on file.  No past surgical history on file.  Social History   Socioeconomic History   Marital status: Single    Spouse name: Not on file   Number of children: Not on file   Years of education: Not on file   Highest education level: Not on file  Occupational History   Not on file  Tobacco Use   Smoking status: Every Day    Types: Cigarettes   Smokeless tobacco: Never  Substance and Sexual Activity   Alcohol use: Yes   Drug use: Not on file   Sexual activity: Not on file  Other Topics Concern   Not on file  Social History Narrative   ** Merged History Encounter **       Social Determinants of Health   Financial Resource Strain: Not on file  Food Insecurity: Not on file  Transportation Needs: Not on file  Physical Activity: Not on file  Stress: Not on file  Social Connections: Not on file  Intimate Partner Violence: Not on file   ***No family history on file.  Current Outpatient Medications  Medication Sig Dispense Refill   furosemide (LASIX) 20 MG tablet TAKE 1 TABLET BY MOUTH EVERY DAY AS NEEDED 90 tablet 1   nitrofurantoin, macrocrystal-monohydrate, (MACROBID) 100 MG capsule Take 1 capsule (100 mg total) by mouth 2 (two) times daily. (Patient not taking: Reported on 05/11/2022) 10 capsule 0   No current facility-administered medications for this visit.    No Known  Allergies  ***REVIEW OF SYSTEMS (negative unless checked):   Cardiac:  []  Chest pain or chest pressure? []  Shortness of breath upon activity? []  Shortness of breath when lying flat? []  Irregular heart rhythm?  Vascular:  []  Pain in calf, thigh, or hip brought on by walking? []  Pain in feet at night that wakes you up from your sleep? []  Blood clot in your veins? []  Leg swelling?  Pulmonary:  []  Oxygen at home? []  Productive cough? []  Wheezing?  Neurologic:  []  Sudden weakness in arms or legs? []  Sudden numbness in arms or legs? []  Sudden onset of difficult speaking or slurred speech? []  Temporary loss of vision in one eye? []  Problems with dizziness?  Gastrointestinal:  []  Blood in stool? []  Vomited blood?  Genitourinary:  []  Burning when urinating? []  Blood in urine?  Psychiatric:  []  Major depression  Hematologic:  []  Bleeding problems? []  Problems with blood clotting?  Dermatologic:  []  Rashes or ulcers?  Constitutional:  []  Fever or chills?  Ear/Nose/Throat:  []  Change in hearing? []  Nose bleeds? []  Sore throat?  Musculoskeletal:  []  Back pain? []  Joint pain? []  Muscle pain?   Physical Examination    There were no vitals filed for this visit. There is no height or weight on file to calculate BMI.  General:  WDWN in NAD; vital signs documented above Gait: Not observed HENT: WNL, normocephalic Pulmonary: normal non-labored  breathing , without Rales, rhonchi,  wheezing Cardiac: {Desc; regular/irreg:14544} HR, without  Murmurs {With/Without:20273} carotid bruit*** Abdomen: soft, NT, no masses Skin: {With/Without:20273} rashes Vascular Exam/Pulses:  Right Left  Radial {Exam; arterial pulse strength 0-4:30167} {Exam; arterial pulse strength 0-4:30167}  Ulnar {Exam; arterial pulse strength 0-4:30167} {Exam; arterial pulse strength 0-4:30167}  Femoral {Exam; arterial pulse strength 0-4:30167} {Exam; arterial pulse strength 0-4:30167}  Popliteal  {Exam; arterial pulse strength 0-4:30167} {Exam; arterial pulse strength 0-4:30167}  DP {Exam; arterial pulse strength 0-4:30167} {Exam; arterial pulse strength 0-4:30167}  PT {Exam; arterial pulse strength 0-4:30167} {Exam; arterial pulse strength 0-4:30167}   Extremities: {With/Without:20273} varicose veins, {With/Without:20273} reticular veins, {With/Without:20273} edema, {With/Without:20273} stasis pigmentation, {With/Without:20273} lipodermatosclerosis, {With/Without:20273} ulcers Musculoskeletal: no muscle wasting or atrophy  Neurologic: A&O X 3;  No focal weakness or paresthesias are detected Psychiatric:  The pt has {Desc; normal/abnormal:11317::"Normal"} affect.  Non-invasive Vascular Imaging   BLE Venous Insufficiency Duplex (***):  RLE:  *** DVT and SVT,  *** GSV reflux ***, GSV diameter *** *** SSV reflux ***, *** deep venous reflux  LLE: *** DVT and SVT,  *** GSV reflux ***,  GSV diameter *** *** SSV reflux ***, *** deep venous reflux   Medical Decision Making   Danielle Bradford is a 46 y.o. female who presents with: ***LE chronic venous insufficiency, ***varicose veins with complications  Based on the patient's history and examination, I recommend: ***. I discussed with the patient the use of her 20-30 mm thigh high compression stockings and need for 3 month trial of such. The patient will follow up in 3 months with Dr. Marland Kitchen Thank you for allowing [place] to participate in this patient's care.   Graceann Congress, PA-C [facility] Office: [phone]  07/24/2022, 3:49 PM  Clinic MD: Myra Gianotti

## 2022-07-27 ENCOUNTER — Ambulatory Visit (HOSPITAL_COMMUNITY): Payer: BC Managed Care – PPO | Attending: Surgery

## 2022-07-27 ENCOUNTER — Ambulatory Visit: Payer: BC Managed Care – PPO

## 2023-11-22 IMAGING — MG DIGITAL DIAGNOSTIC BILAT W/ TOMO W/ CAD
8 of 14 series · 8 of 34 positions shown · non-contrast
Comparison: Previous exam(s).

CLINICAL DATA: 45-year-old female presenting as a recall from
baseline screening for possible right breast mass and possible left
breast calcifications.

EXAM:
DIGITAL DIAGNOSTIC BILATERAL MAMMOGRAM WITH TOMOSYNTHESIS AND CAD;
ULTRASOUND RIGHT BREAST LIMITED
TECHNIQUE: Bilateral digital diagnostic mammography and breast tomosynthesis
was performed. The images were evaluated with computer-aided
detection.; Targeted ultrasound examination of the right breast was
performed

[L ML (1 of 3)]
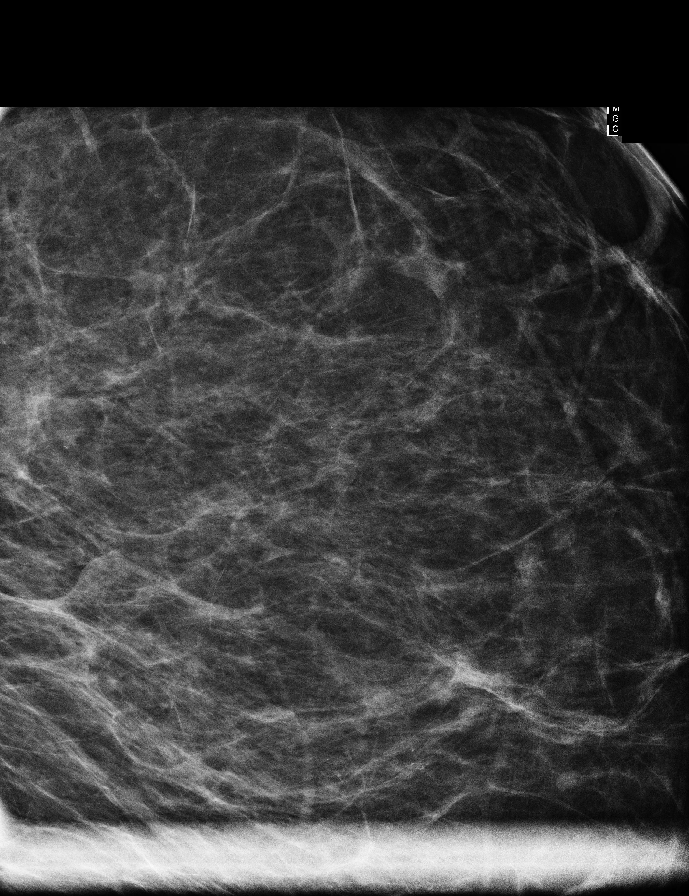

[L ML (2 of 3)]
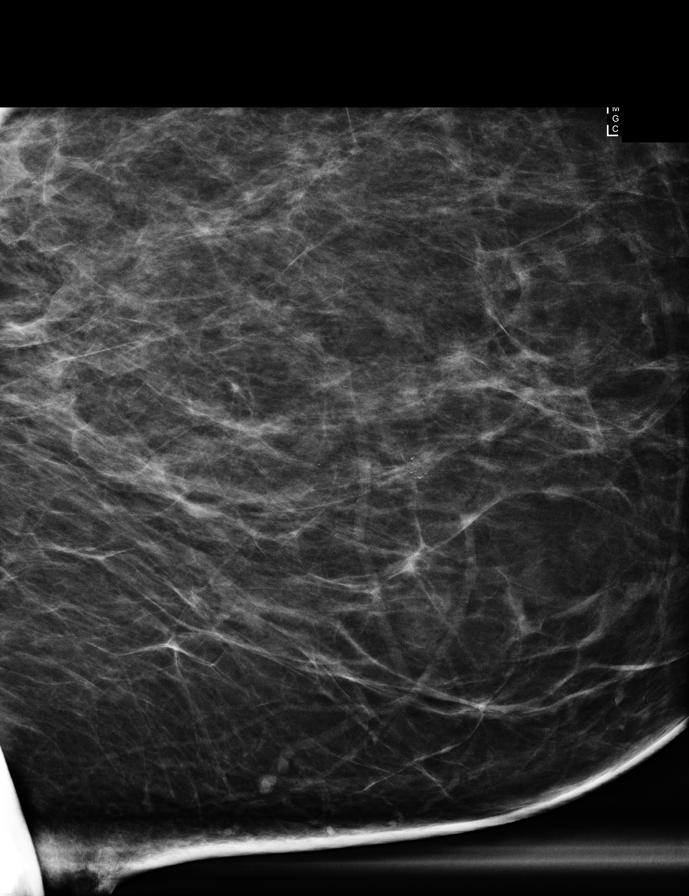

[L CC]
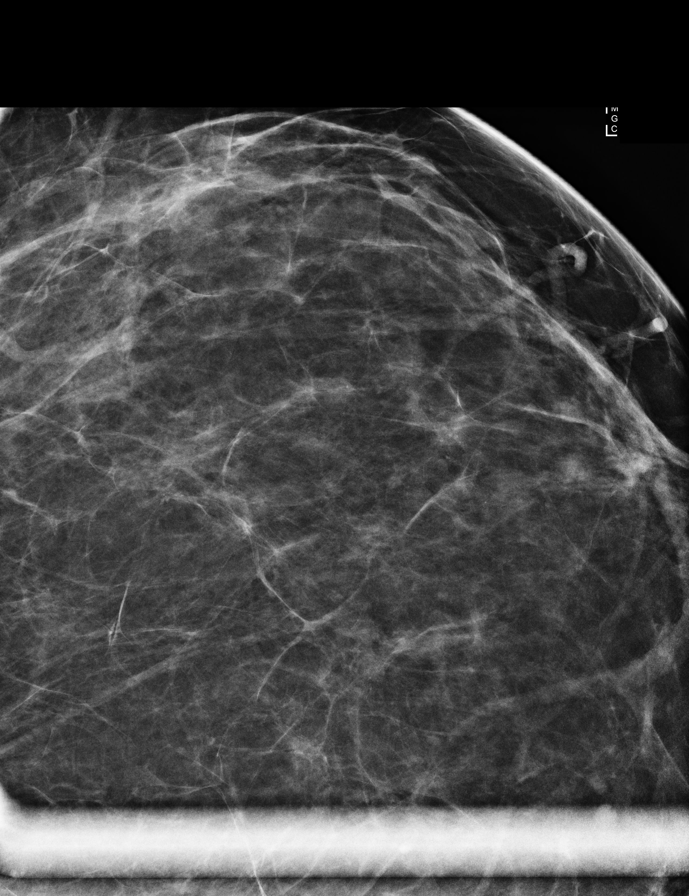

[L ML (3 of 3)]
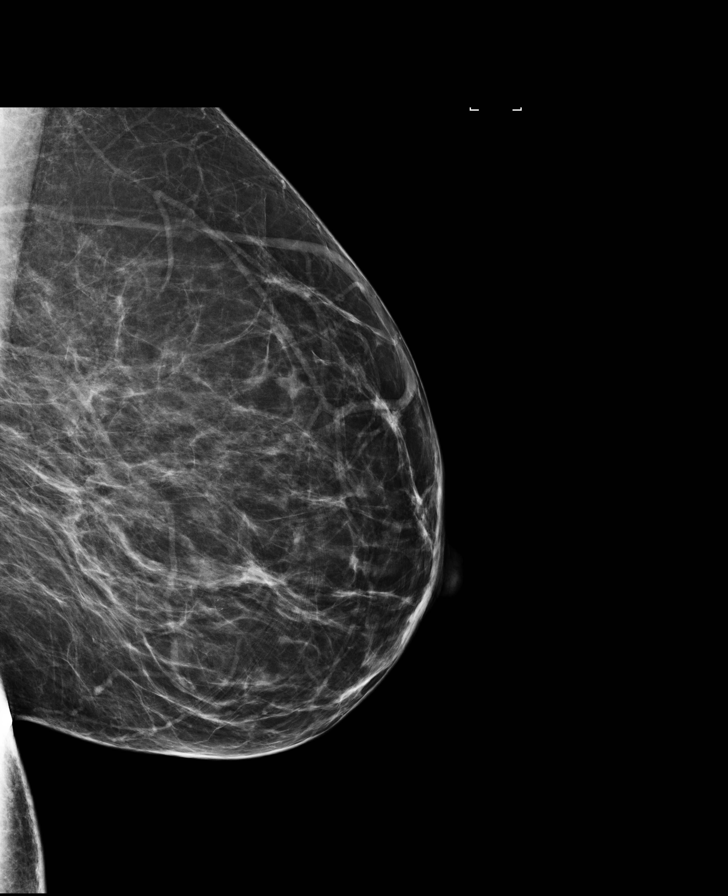

[R MLO synth-2D (1 of 3)]
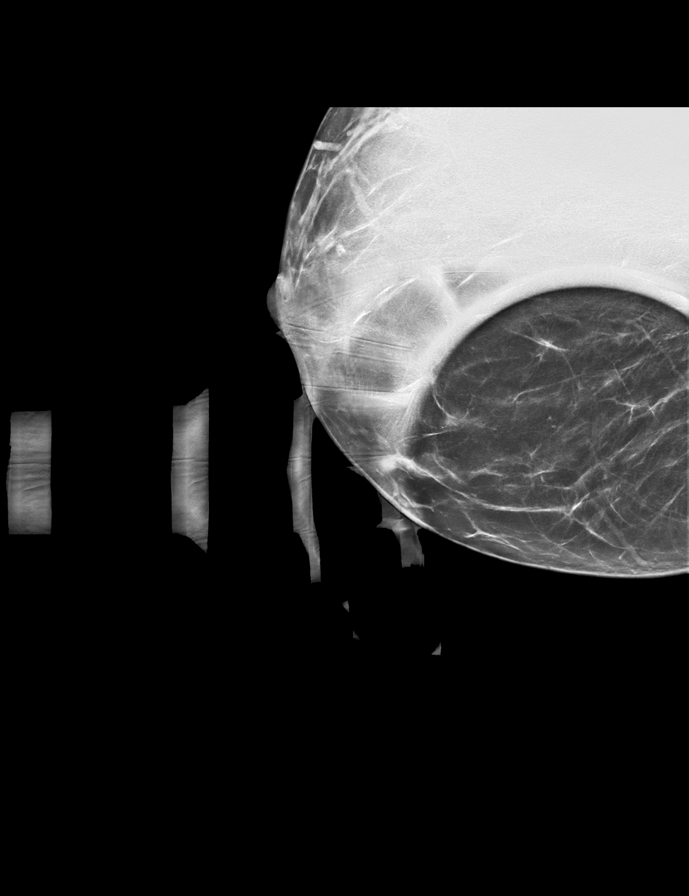

[R MLO synth-2D (2 of 3)]
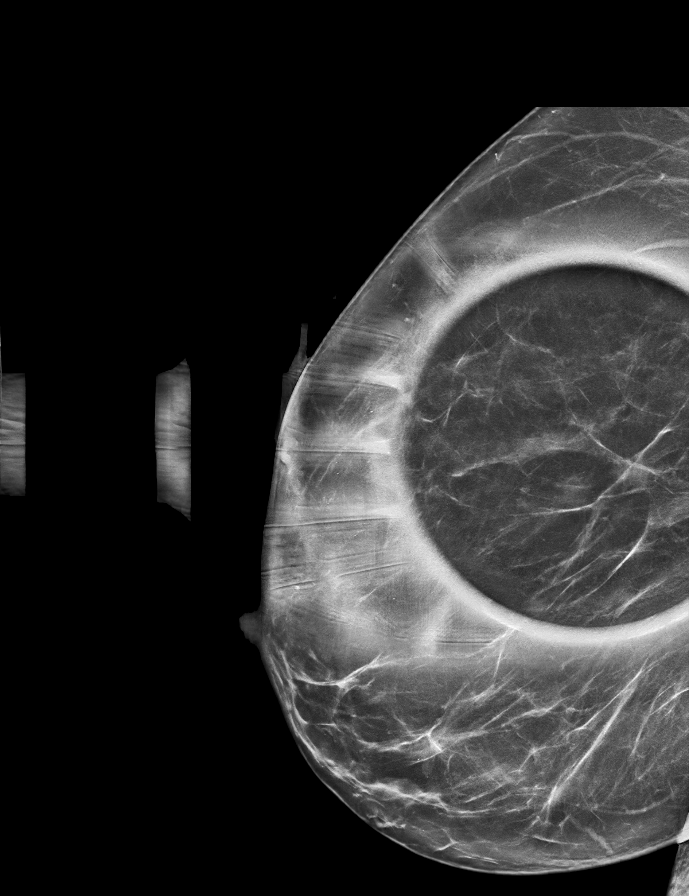

[R MLO synth-2D (3 of 3)]
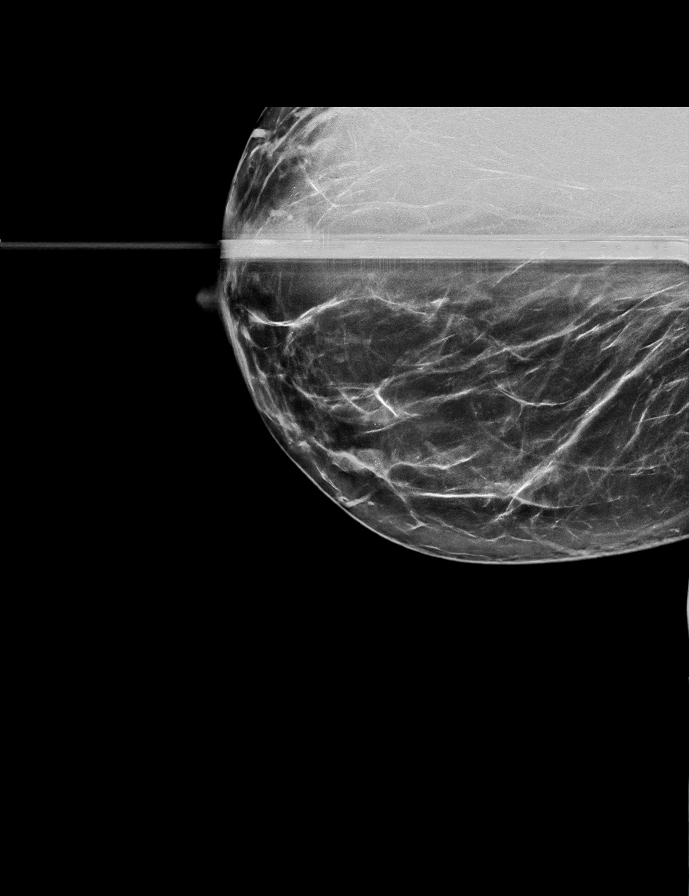

[R CC synth-2D]
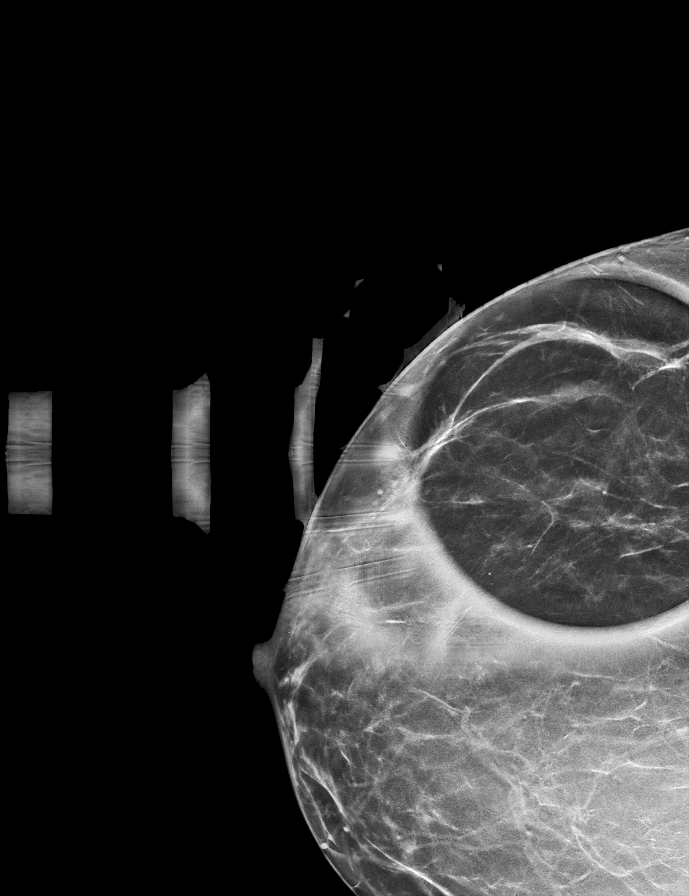

[8 of 34 positions shown; findings below may reference images not displayed]

ACR Breast Density Category b: There are scattered areas of
fibroglandular density.
FINDINGS: Mammogram:

Right breast: Spot compression tomosynthesis views of the right
breast performed demonstrating persistence of an oval mass in the
central inferior right breast measuring approximately 1.0 cm. There
is also persistence of a small obscured mass in the upper outer
right breast with adjacent calcification.

Left breast: Spot 2D magnification views of the left breast as well
as full field true lateral views were performed demonstrating
persistence of a small group of calcifications best seen on the true
lateral view in the outer breast which layer, consistent with benign
milk of calcium.

Ultrasound:

Targeted ultrasound is performed in the right breast at 6 o'clock 3
cm from the nipple demonstrating an oval circumscribed anechoic mass
measuring 0.8 x 0.5 x 0.7 cm, consistent with a benign simple cyst.
This corresponds to the mammographic finding. There is an additional
similar appearing smaller mass at 6:30 o'clock 3 cm from the nipple
also consistent with a benign cyst.

At 10 o'clock 8 cm from the nipple there is an oval circumscribed
anechoic mass measuring 0.8 x 0.3 x 0.7 cm, consistent with a simple
cyst. This corresponds to the mammographic finding. There is another
small simple cyst at 9:30 o'clock 8 cm from the nipple.
IMPRESSION: 1. Benign simple cysts in the central inferior and upper outer right
breast.

2.  Benign milk of calcium calcifications in the outer left breast.

RECOMMENDATION:
Screening mammogram in one year.(Code:S9-Q-1C0)

I have discussed the findings and recommendations with the patient.
If applicable, a reminder letter will be sent to the patient
regarding the next appointment.

BI-RADS CATEGORY  2: Benign.

## 2024-05-03 ENCOUNTER — Encounter: Payer: Self-pay | Admitting: Family Medicine

## 2024-06-13 ENCOUNTER — Ambulatory Visit: Admitting: Family Medicine

## 2024-06-15 ENCOUNTER — Encounter: Admitting: Family Medicine
